# Patient Record
Sex: Female | Born: 1960 | Race: Black or African American | Hispanic: No | Marital: Single | Smoking: Never smoker
Health system: Southern US, Community
[De-identification: ages and names within clinical notes are randomized; demographics above are authoritative.]

## PROBLEM LIST (undated history)

## (undated) DIAGNOSIS — F419 Anxiety disorder, unspecified: Secondary | ICD-10-CM

## (undated) DIAGNOSIS — J449 Chronic obstructive pulmonary disease, unspecified: Secondary | ICD-10-CM

## (undated) DIAGNOSIS — M109 Gout, unspecified: Secondary | ICD-10-CM

## (undated) DIAGNOSIS — M797 Fibromyalgia: Secondary | ICD-10-CM

## (undated) DIAGNOSIS — I10 Essential (primary) hypertension: Secondary | ICD-10-CM

---

## 2020-03-28 ENCOUNTER — Other Ambulatory Visit: Payer: Self-pay

## 2020-03-28 ENCOUNTER — Emergency Department (HOSPITAL_COMMUNITY)
Admission: EM | Admit: 2020-03-28 | Discharge: 2020-03-29 | Discharge status: Against Medical Advice | Payer: Federal, State, Local not specified - PPO | Attending: Emergency Medicine | Admitting: Emergency Medicine

## 2020-03-28 ENCOUNTER — Encounter (HOSPITAL_COMMUNITY): Payer: Self-pay

## 2020-03-28 ENCOUNTER — Emergency Department (HOSPITAL_COMMUNITY): Payer: Federal, State, Local not specified - PPO

## 2020-03-28 DIAGNOSIS — R0602 Shortness of breath: Secondary | ICD-10-CM | POA: Diagnosis not present

## 2020-03-28 DIAGNOSIS — W108XXA Fall (on) (from) other stairs and steps, initial encounter: Secondary | ICD-10-CM | POA: Diagnosis not present

## 2020-03-28 DIAGNOSIS — R0981 Nasal congestion: Secondary | ICD-10-CM | POA: Insufficient documentation

## 2020-03-28 DIAGNOSIS — J449 Chronic obstructive pulmonary disease, unspecified: Secondary | ICD-10-CM | POA: Insufficient documentation

## 2020-03-28 DIAGNOSIS — Y9301 Activity, walking, marching and hiking: Secondary | ICD-10-CM | POA: Diagnosis not present

## 2020-03-28 DIAGNOSIS — R42 Dizziness and giddiness: Secondary | ICD-10-CM | POA: Insufficient documentation

## 2020-03-28 DIAGNOSIS — M25532 Pain in left wrist: Secondary | ICD-10-CM | POA: Diagnosis not present

## 2020-03-28 DIAGNOSIS — S0081XA Abrasion of other part of head, initial encounter: Secondary | ICD-10-CM | POA: Diagnosis not present

## 2020-03-28 DIAGNOSIS — M25562 Pain in left knee: Secondary | ICD-10-CM | POA: Diagnosis not present

## 2020-03-28 DIAGNOSIS — Y92009 Unspecified place in unspecified non-institutional (private) residence as the place of occurrence of the external cause: Secondary | ICD-10-CM | POA: Diagnosis not present

## 2020-03-28 DIAGNOSIS — R531 Weakness: Secondary | ICD-10-CM | POA: Insufficient documentation

## 2020-03-28 DIAGNOSIS — I10 Essential (primary) hypertension: Secondary | ICD-10-CM | POA: Insufficient documentation

## 2020-03-28 DIAGNOSIS — R059 Cough, unspecified: Secondary | ICD-10-CM | POA: Diagnosis not present

## 2020-03-28 DIAGNOSIS — M25522 Pain in left elbow: Secondary | ICD-10-CM | POA: Diagnosis not present

## 2020-03-28 DIAGNOSIS — R630 Anorexia: Secondary | ICD-10-CM | POA: Insufficient documentation

## 2020-03-28 DIAGNOSIS — W19XXXA Unspecified fall, initial encounter: Secondary | ICD-10-CM

## 2020-03-28 DIAGNOSIS — S0993XA Unspecified injury of face, initial encounter: Secondary | ICD-10-CM | POA: Diagnosis present

## 2020-03-28 HISTORY — DX: Fibromyalgia: M79.7

## 2020-03-28 HISTORY — DX: Gout, unspecified: M10.9

## 2020-03-28 HISTORY — DX: Chronic obstructive pulmonary disease, unspecified: J44.9

## 2020-03-28 HISTORY — DX: Anxiety disorder, unspecified: F41.9

## 2020-03-28 HISTORY — DX: Essential (primary) hypertension: I10

## 2020-03-28 MED ORDER — IBUPROFEN 800 MG PO TABS
800.0000 mg | ORAL_TABLET | Freq: Once | ORAL | Status: AC
  Administered 2020-03-29: 800 mg via ORAL
  Filled 2020-03-28: qty 1

## 2020-03-28 NOTE — ED Triage Notes (Signed)
Pt fell on the steps tonight and she complains of left elbow, arm, hand and knee pain

## 2020-03-28 NOTE — ED Provider Notes (Signed)
[facility] EMERGENCY DEPARTMENT Provider Note  CSN: [id number] Arrival date & time: 03/28/20 2132    History Chief Complaint  Patient presents with  . Fall    HPI  Angelica Barrett is a 60 y.o. female with history of anxiety, COPD and fibromyalgia who stopped in [place] to visit her significant other's daughter enroute from their vacation in [place] to her home in [place]. She was walking up the stairs into the house this evening and got dizzy, falling over onto her left side. She did not hit her head or have any LOC. She is complaining of moderate to severe aching pain in her L knee, L wrist and L elbow. She states she has not been feeling well the last few days with cough, congestion, decreased appetite and general weakness. She went to the ED while in [place] but did not stay to be seen due to long wait times. She denies any fever, vomiting diarrhea.    Past Medical History:  Diagnosis Date  . Anxiety   . COPD (chronic obstructive pulmonary disease) (HCC)   . Fibromyalgia   . Gout   . Hypertension     History reviewed. No pertinent surgical history.  History reviewed. No pertinent family history.  Social History   Tobacco Use  . Smoking status: Never Smoker  . Smokeless tobacco: Never Used  Substance Use Topics  . Alcohol use: Never  . Drug use: Never     Home Medications Prior to Admission medications   Not on File     Allergies    Patient has no allergy information on record.   Review of Systems   Review of Systems A comprehensive review of systems was completed and negative except as noted in HPI.    Physical Exam BP (!) 149/92 (BP Location: Right Arm)   Pulse 76   Temp 99.1 F (37.3 C) (Oral)   Resp 19   Ht 5\' 4"  (1.626 m)   Wt 104.3 kg   SpO2 95%   BMI 39.48 kg/m   Physical Exam Vitals and nursing note reviewed.  Constitutional:      Appearance: Normal appearance.  HENT:     Head: Normocephalic and atraumatic.     Comments:  Excoriations on forehead    Nose: Nose normal.     Mouth/Throat:     Mouth: Mucous membranes are moist.  Eyes:     Extraocular Movements: Extraocular movements intact.     Conjunctiva/sclera: Conjunctivae normal.  Cardiovascular:     Rate and Rhythm: Normal rate.  Pulmonary:     Effort: Pulmonary effort is normal.     Breath sounds: Normal breath sounds.  Abdominal:     General: Abdomen is flat.     Palpations: Abdomen is soft.     Tenderness: There is no abdominal tenderness.  Musculoskeletal:        General: Tenderness (L anterior knee, L dorsal wrist and L elbow) present. No swelling or deformity. Normal range of motion.     Cervical back: Neck supple. Tenderness (L paraspinal muscle tenderness) present.  Skin:    General: Skin is warm and dry.  Neurological:     General: No focal deficit present.     Mental Status: She is alert.  Psychiatric:        Mood and Affect: Mood normal.      ED Results / Procedures / Treatments   Labs (all labs ordered are listed, but only abnormal results are displayed) Labs Reviewed  COMPREHENSIVE METABOLIC PANEL  CBC WITH DIFFERENTIAL/PLATELET  URINALYSIS, ROUTINE W REFLEX MICROSCOPIC    EKG EKG Interpretation  Date/Time:  Thursday March 28 2020 23:29:18 EST Ventricular Rate:  72 PR Interval:    QRS Duration: 93 QT Interval:  440 QTC Calculation: 482 R Axis:   15 Text Interpretation: Sinus rhythm Abnormal R-wave progression, early transition Borderline T abnormalities, anterior leads No old tracing to compare Confirmed by Susy Frizzle [phone]) on 03/28/2020 11:50:22 PM   Radiology DG Chest 2 View  Result Date: 03/28/2020 CLINICAL DATA:  Status post fall. EXAM: CHEST - 2 VIEW COMPARISON:  None. FINDINGS: A 6 mm calcified granuloma is seen within the right lung base. There is no evidence of acute infiltrate, pleural effusion or pneumothorax. The heart size and mediastinal contours are within normal limits. There is tortuosity of  the descending thoracic aorta. A radiopaque fusion plate and screws are seen overlying the lower cervical spine. 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 IMPRESSION: No active cardiopulmonary disease. Electronically Signed   By: Aram Candela M.D.   On: 03/28/2020 23:08   DG Elbow Complete Left  Result Date: 03/28/2020 CLINICAL DATA:  Status post fall. EXAM: LEFT ELBOW - COMPLETE 3+ VIEW COMPARISON:  None. FINDINGS: There is no evidence of fracture, dislocation, or joint effusion. There is no evidence of arthropathy or other focal bone abnormality. Soft tissues are unremarkable. IMPRESSION: Negative. Electronically Signed   By: Aram Candela M.D.   On: 03/28/2020 23:11   DG Wrist Complete Left  Result Date: 03/28/2020 CLINICAL DATA:  Status post fall. EXAM: LEFT WRIST - COMPLETE 3+ VIEW COMPARISON:  None. FINDINGS: There is no evidence of an acute fracture or dislocation. A radiopaque fixation plate and screws are seen extending across the distal left radius, adjacent carpal bones and proximal portion of the third left metacarpal. A chronic deformity of the distal left ulna is seen. Soft tissues are unremarkable. IMPRESSION: Extensive postoperative changes without evidence of acute osseous abnormality. Electronically Signed   By: Aram Candela M.D.   On: 03/28/2020 23:11   DG Knee Complete 4 Views Left  Result Date: 03/28/2020 CLINICAL DATA:  Status post fall. EXAM: LEFT KNEE - COMPLETE 4+ VIEW COMPARISON:  None. FINDINGS: A radiopaque left knee replacement is seen. There is no evidence of surrounding lucency to suggest the presence of hardware loosening or infection. No evidence of an acute fracture or dislocation. No evidence of arthropathy or other focal bone abnormality. A small joint effusion is present. IMPRESSION: 1. Left knee replacement without evidence of hardware complication or acute fracture. 2. Small joint effusion. Electronically Signed   By: Aram Candela M.D.    On: 03/28/2020 23:09    Procedures Procedures  Medications Ordered in the ED Medications  ibuprofen (ADVIL) tablet 800 mg (800 mg Oral Given 03/29/20 0003)     MDM Rules/Calculators/A&P MDM Will send for imaging of injured areas. She would also like evaluation for her cough, SOB and dizziness. Labs, CXR and EKG ordered.   ED Course  I have reviewed the triage vital signs and the nursing notes.  Pertinent labs & imaging results that were available during my care of the patient were reviewed by me and considered in my medical decision making (see chart for details).  Clinical Course as of 03/29/20 0012  Thu Mar 28, 2020  2242 Patient requesting something for pain. 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  [  CS]  2315 Xrays are negative for acute injury. Medical workup is pending.  [CS]  2350 Care of the patient signed out to Dr. Freida Busman at the change of shift  [CS]    Clinical Course User Index [CS] Pollyann Savoy, MD    Final Clinical Impression(s) / ED Diagnoses Final diagnoses:  None    Rx / DC Orders ED Discharge Orders    None       Pollyann Savoy, MD 03/29/20 [phone]

## 2020-03-29 DIAGNOSIS — S0081XA Abrasion of other part of head, initial encounter: Secondary | ICD-10-CM | POA: Diagnosis not present

## 2020-03-29 LAB — CBC WITH DIFFERENTIAL/PLATELET
Abs Immature Granulocytes: 0.03 10*3/uL (ref 0.00–0.07)
Basophils Absolute: 0.1 10*3/uL (ref 0.0–0.1)
Basophils Relative: 1 %
Eosinophils Absolute: 0.3 10*3/uL (ref 0.0–0.5)
Eosinophils Relative: 3 %
HCT: 36.6 % (ref 36.0–46.0)
Hemoglobin: 11.6 g/dL — ABNORMAL LOW (ref 12.0–15.0)
Immature Granulocytes: 0 %
Lymphocytes Relative: 29 %
Lymphs Abs: 2.9 10*3/uL (ref 0.7–4.0)
MCH: 29.5 pg (ref 26.0–34.0)
MCHC: 31.7 g/dL (ref 30.0–36.0)
MCV: 93.1 fL (ref 80.0–100.0)
Monocytes Absolute: 1 10*3/uL (ref 0.1–1.0)
Monocytes Relative: 10 %
Neutro Abs: 5.6 10*3/uL (ref 1.7–7.7)
Neutrophils Relative %: 57 %
Platelets: 289 10*3/uL (ref 150–400)
RBC: 3.93 MIL/uL (ref 3.87–5.11)
RDW: 16.1 % — ABNORMAL HIGH (ref 11.5–15.5)
WBC: 9.8 10*3/uL (ref 4.0–10.5)
nRBC: 0 % (ref 0.0–0.2)

## 2020-03-29 LAB — COMPREHENSIVE METABOLIC PANEL
ALT: 43 U/L (ref 0–44)
AST: 45 U/L — ABNORMAL HIGH (ref 15–41)
Albumin: 3.9 g/dL (ref 3.5–5.0)
Alkaline Phosphatase: 233 U/L — ABNORMAL HIGH (ref 38–126)
Anion gap: 13 (ref 5–15)
BUN: 17 mg/dL (ref 6–20)
CO2: 24 mmol/L (ref 22–32)
Calcium: 9.1 mg/dL (ref 8.9–10.3)
Chloride: 102 mmol/L (ref 98–111)
Creatinine, Ser: 0.67 mg/dL (ref 0.44–1.00)
GFR, Estimated: >60 mL/min (ref >60)
Glucose, Bld: 119 mg/dL — ABNORMAL HIGH (ref 70–99)
Potassium: 3.3 mmol/L — ABNORMAL LOW (ref 3.5–5.1)
Sodium: 139 mmol/L (ref 135–145)
Total Bilirubin: 0.8 mg/dL (ref 0.3–1.2)
Total Protein: 6.9 g/dL (ref 6.5–8.1)

## 2020-03-29 NOTE — ED Provider Notes (Signed)
Patient signed to me by Dr. Bernette Mayers.  Patient left AMA before treatment was completed   Lorre Nick, MD 03/29/20 [phone]

## 2020-03-29 NOTE — ED Notes (Signed)
This nurse spoke with pt about receiving her d/c instructions and speaking to the doctor. Pt requested this nurse taker her IV out. Pt and family left AMA

## 2021-07-22 IMAGING — CR DG WRIST COMPLETE 3+V*L*
4 series · 4 of 4 positions shown · non-contrast
Comparison: None.

CLINICAL DATA: Status post fall.

EXAM:
LEFT WRIST - COMPLETE 3+ VIEW

[x wrist pa left]
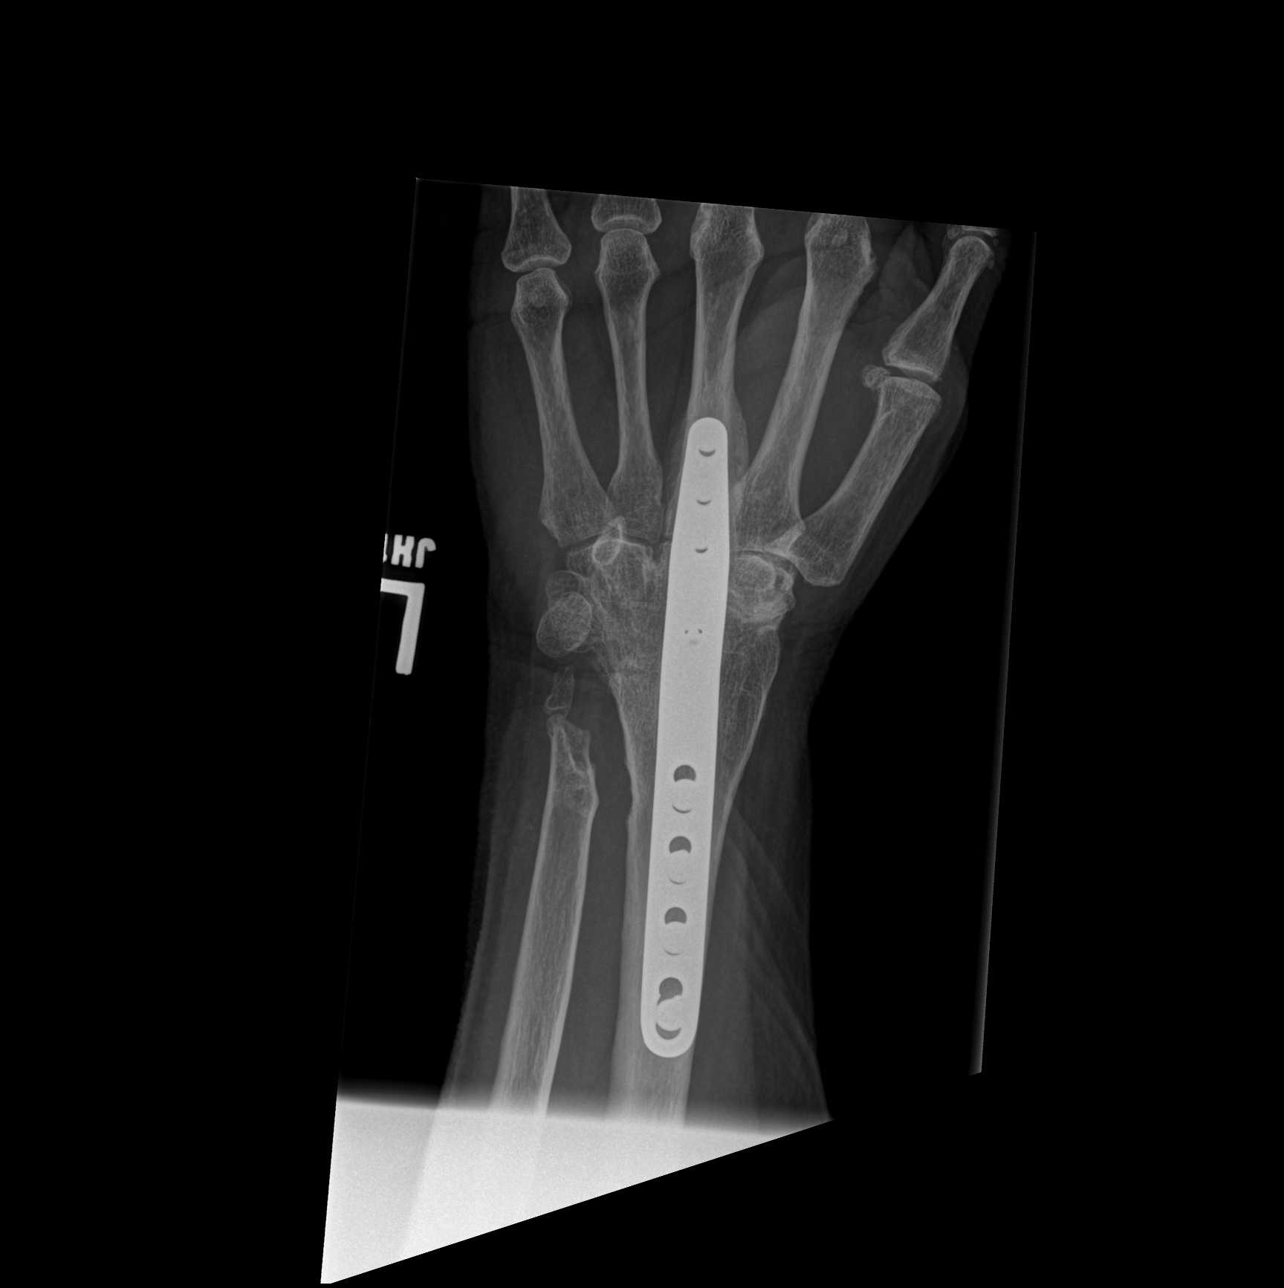

[x wrist obl left]
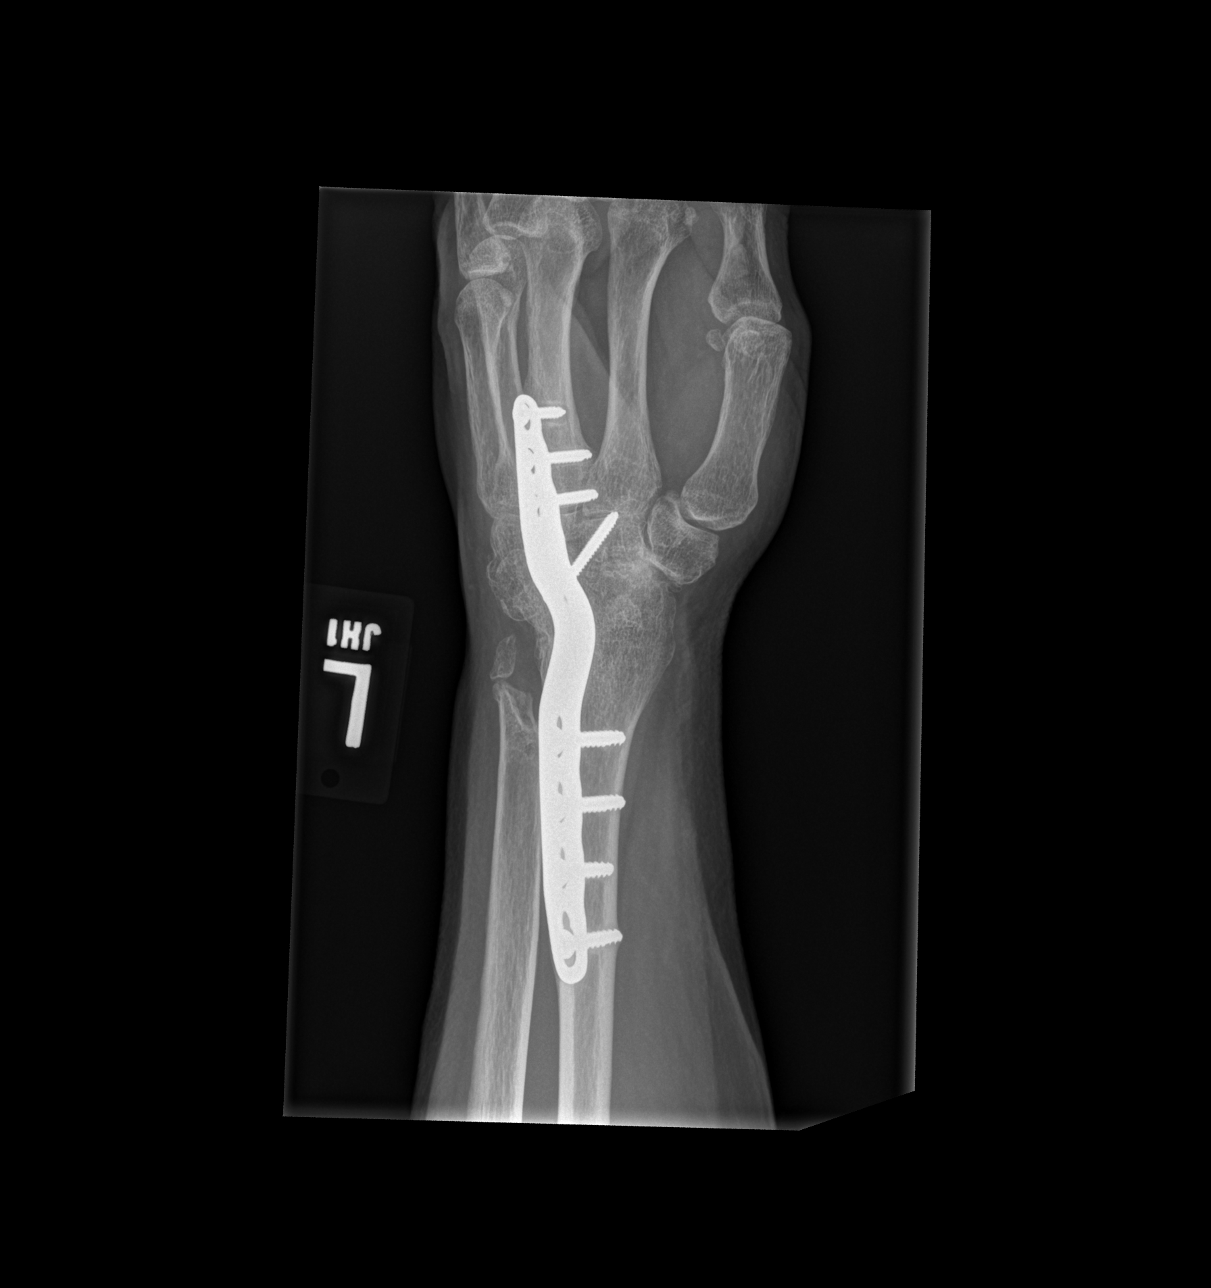

[x wrist lat left]
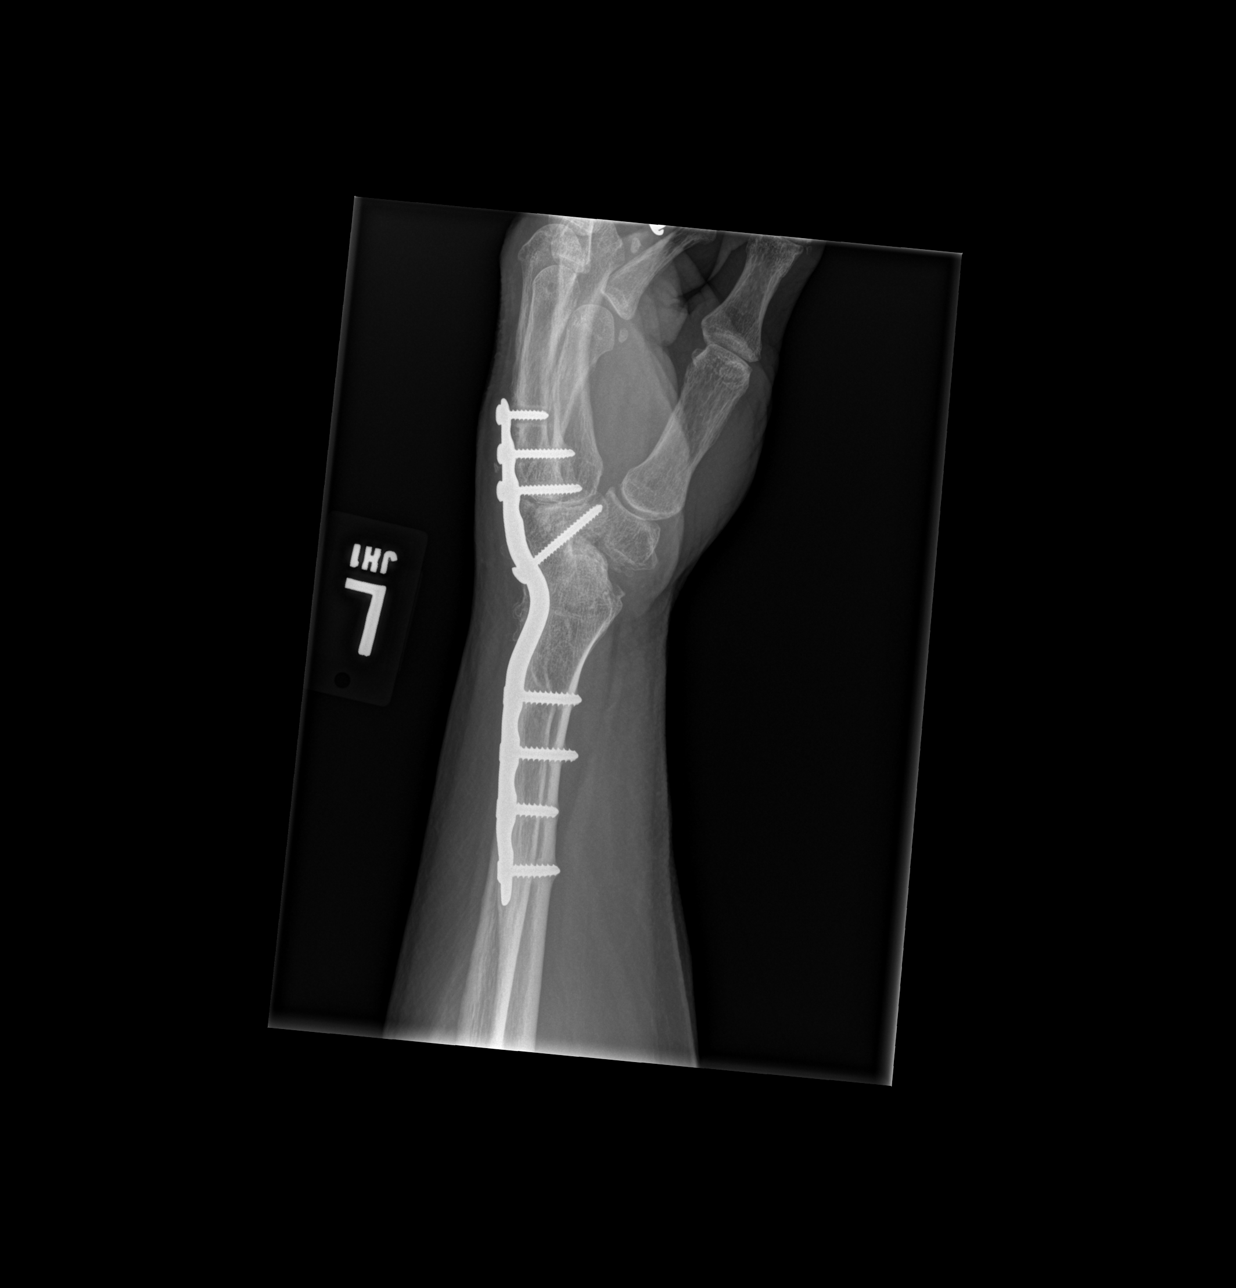

[x wrist navicular view left]
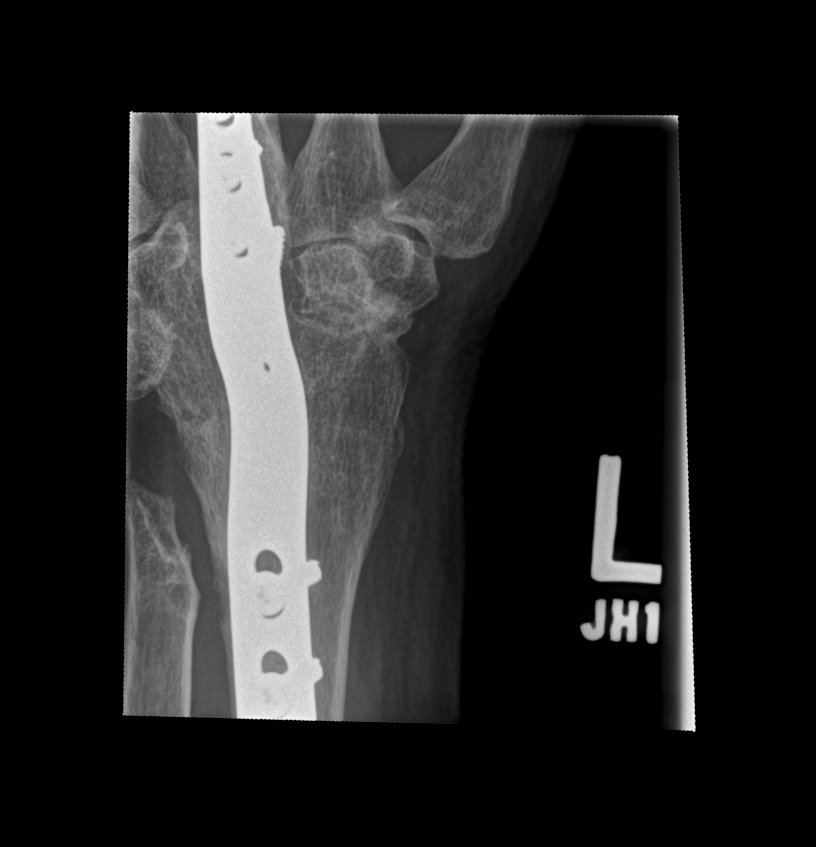

[4 of 4 positions shown; findings below may reference images not displayed]

FINDINGS: There is no evidence of an acute fracture or dislocation. A
radiopaque fixation plate and screws are seen extending across the
distal left radius, adjacent carpal bones and proximal portion of
the third left metacarpal. A chronic deformity of the distal left
ulna is seen. Soft tissues are unremarkable.
IMPRESSION: Extensive postoperative changes without evidence of acute osseous
abnormality.
